# Patient Record
Sex: Female | Born: 2000 | Race: Black or African American | Hispanic: No | Marital: Single | Smoking: Never smoker
Health system: Southern US, Community
[De-identification: ages and names within clinical notes are randomized; demographics above are authoritative.]

## PROBLEM LIST (undated history)

## (undated) DIAGNOSIS — R7303 Prediabetes: Secondary | ICD-10-CM

## (undated) HISTORY — PX: TONSILLECTOMY: SUR1361

---
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
## 2022-11-02 ENCOUNTER — Ambulatory Visit (HOSPITAL_COMMUNITY): Payer: Self-pay

## 2022-11-02 ENCOUNTER — Ambulatory Visit (HOSPITAL_COMMUNITY)
Admission: EM | Admit: 2022-11-02 | Discharge: 2022-11-02 | Disposition: A | Discharge status: Home / Self Care | Payer: BC Managed Care – PPO | Attending: Emergency Medicine | Admitting: Emergency Medicine

## 2022-11-02 ENCOUNTER — Encounter (HOSPITAL_COMMUNITY): Payer: Self-pay

## 2022-11-02 DIAGNOSIS — N760 Acute vaginitis: Secondary | ICD-10-CM | POA: Diagnosis not present

## 2022-11-02 DIAGNOSIS — Z113 Encounter for screening for infections with a predominantly sexual mode of transmission: Secondary | ICD-10-CM | POA: Diagnosis not present

## 2022-11-02 LAB — HIV ANTIBODY (ROUTINE TESTING W REFLEX): HIV Screen 4th Generation wRfx: NONREACTIVE

## 2022-11-02 LAB — POCT URINALYSIS DIP (MANUAL ENTRY)
Bilirubin, UA: NEGATIVE
Blood, UA: NEGATIVE
Glucose, UA: 500 mg/dL — AB
Leukocytes, UA: NEGATIVE
Nitrite, UA: NEGATIVE
Protein Ur, POC: 100 mg/dL — AB
Spec Grav, UA: 1.025 (ref 1.010–1.025)
Urobilinogen, UA: 0.2 U/dL
pH, UA: 7 (ref 5.0–8.0)

## 2022-11-02 LAB — POCT URINE PREGNANCY: Preg Test, Ur: NEGATIVE

## 2022-11-02 NOTE — ED Provider Notes (Signed)
[facility]    CSN: [id number] Arrival date & time: 11/02/22  1519      History   Chief Complaint Chief Complaint  Patient presents with   Vaginal Discharge    HPI Sara Rocha is a 22 y.o. female.   Patient presents to clinic for complaints of increased vaginal discharge with a mild odor for the past 2 months.  She has tried over-the-counter Monistat without relief.  Denies any vaginal itching.  Denies any nausea or vomiting.  Initially had discomfort with urination, none currently.  Any flank pain.  No fevers.  Reports she is normally tachycardic at the doctors, she does get nervous.  She does have type 2 diabetes, reports this is controlled with injectables.  She does not check her blood sugars at home.   The history is provided by the patient and medical records.  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      Home Medications    Prior to Admission medications   Medication Sig Start Date End Date Taking? Authorizing Provider  fluticasone (FLONASE) 50 MCG/ACT nasal spray Place 2 sprays into both nostrils daily for 5 days. 10/23/21 10/28/21  Antony Madura, PA-C  Guaifenesin 1200 MG TB12 Take 1 tablet (1,200 mg total) by mouth in the morning and at bedtime. 10/19/21   Carlisle Beers, FNP  ranitidine (ZANTAC) 150 MG tablet TAke one  tablet, twice daily. Patient not taking: Reported on 10/19/2021 04/28/13 04/29/14  David Stall, MD    Family History Family History  Problem Relation Age of Onset   Obesity Mother    Diabetes Mother    Hypertension Mother     Social History Social History   Tobacco Use   Smoking status: Never   Smokeless tobacco: Never  Vaping Use   Vaping status: Never Used  Substance Use Topics   Alcohol use: Never   Drug use: Never     Allergies   Patient has no known allergies.   Review of Systems Review of Systems  Constitutional:  Negative for fever.  Gastrointestinal:  Negative for abdominal pain, nausea and vomiting.  Genitourinary:  Positive for vaginal discharge. Negative for dysuria, flank pain, frequency, menstrual problem, pelvic pain and urgency.     Physical Exam Triage Vital Signs ED Triage Vitals  Encounter Vitals Group     BP 11/02/22 1613 (!) 147/89     Systolic BP Percentile --      Diastolic BP Percentile --      Pulse Rate 11/02/22 1613 (!) 114     Resp 11/02/22 1613 16     Temp 11/02/22 1613 98.2 F (36.8 C)     Temp Source 11/02/22 1613 Oral     SpO2 11/02/22 1613 100 %     Weight --      Height --  Head Circumference --      Peak Flow --      Pain Score 11/02/22 1612 0     Pain Loc --      Pain Education --      Exclude from Growth Chart --    No data found.  Updated Vital Signs BP (!) 147/89 (BP Location: Right Arm)   Pulse (!) 114   Temp 98.2 F (36.8 C) (Oral)   Resp 16   LMP 10/19/2022 (Exact Date)   SpO2 100%   Visual Acuity Right Eye Distance:   Left Eye Distance:   Bilateral Distance:    Right Eye Near:   Left Eye Near:    Bilateral Near:     Physical Exam Vitals and nursing note reviewed.  Constitutional:      Appearance: Normal appearance.  HENT:     Head: Normocephalic and atraumatic.     Right Ear: External ear normal.     Left Ear: External ear normal.     Nose: Nose normal.     Mouth/Throat:      Mouth: Mucous membranes are moist.  Eyes:     Conjunctiva/sclera: Conjunctivae normal.  Cardiovascular:     Rate and Rhythm: Tachycardia present.  Musculoskeletal:        General: Normal range of motion.  Skin:    General: Skin is warm and dry.  Neurological:     General: No focal deficit present.     Mental Status: She is alert and oriented to person, place, and time.  Psychiatric:        Mood and Affect: Mood normal.        Behavior: Behavior normal.      UC Treatments / Results  Labs (all labs ordered are listed, but only abnormal results are displayed) Labs Reviewed  POCT URINALYSIS DIP (MANUAL ENTRY) - Abnormal; Notable for the following components:      Result Value   Glucose, UA =500 (*)    Ketones, POC UA trace (5) (*)    Protein Ur, POC =100 (*)    All other components within normal limits  RPR  HIV ANTIBODY (ROUTINE TESTING W REFLEX)  POCT URINE PREGNANCY  CERVICOVAGINAL ANCILLARY ONLY    EKG   Radiology No results found.  Procedures Procedures (including critical care time)  Medications Ordered in UC Medications - No data to display  Initial Impression / Assessment and Plan / UC Course  I have reviewed the triage vital signs and the nursing notes.  Pertinent labs & imaging results that were available during my care of the patient were reviewed by me and considered in my medical decision making (see chart for details).  Vitals and triage reviewed, patient is hemodynamically stable.  Has been having changes to vaginal discharge with odor for the past 2 months.  Cytology swab obtained.  Initially had dysuria, none currently.  UA does not show any acute infection.  Negative for pregnancy.  Will wait to treat until all results have been received.  Staff will contact if results requiring treatment.  Plan of care, follow-up care and return precautions given, no questions at this time.     Final Clinical Impressions(s) / UC Diagnoses   Final diagnoses:   Acute vaginitis  Screening examination for sexually transmitted disease     Discharge Instructions      Your urine did not show any signs of infection.  We have screened you for bacterial vaginosis, yeast and sexually transmitted infections.  Results will be available via MyChart and staff will contact you if results are positive, requiring treatment.  Abstain from intercourse until all results have been received and notify any sexual partners of positives.  Return to clinic for any new or urgent symptoms.     ED Prescriptions   None    PDMP not reviewed this encounter.   Dragon Thrush, [place], [place] 11/02/22 1721

## 2022-11-02 NOTE — ED Triage Notes (Signed)
Pt presents with c/o vaginal discharge x 2 mo.   Denies other sxs.  Pt states she wants STD testing and requests blood work.

## 2022-11-02 NOTE — Discharge Instructions (Signed)
Your urine did not show any signs of infection.  We have screened you for bacterial vaginosis, yeast and sexually transmitted infections.  Results will be available via MyChart and staff will contact you if results are positive, requiring treatment.  Abstain from intercourse until all results have been received and notify any sexual partners of positives.  Return to clinic for any new or urgent symptoms.

## 2022-11-03 ENCOUNTER — Telehealth (HOSPITAL_COMMUNITY): Payer: Self-pay

## 2022-11-03 ENCOUNTER — Telehealth (HOSPITAL_COMMUNITY): Payer: Self-pay | Admitting: Physician Assistant

## 2022-11-03 ENCOUNTER — Ambulatory Visit (HOSPITAL_COMMUNITY): Payer: Self-pay

## 2022-11-03 LAB — RPR: RPR Ser Ql: NONREACTIVE

## 2022-11-03 LAB — CERVICOVAGINAL ANCILLARY ONLY
Bacterial Vaginitis (gardnerella): NEGATIVE
Candida Glabrata: POSITIVE — AB
Candida Vaginitis: NEGATIVE
Chlamydia: NEGATIVE
Comment: NEGATIVE
Comment: NEGATIVE
Comment: NEGATIVE
Comment: NEGATIVE
Comment: NEGATIVE
Comment: NORMAL
Neisseria Gonorrhea: NEGATIVE
Trichomonas: NEGATIVE

## 2022-11-03 MED ORDER — FLUCONAZOLE 150 MG PO TABS: 150.0000 mg | ORAL_TABLET | ORAL | 0 refills | Status: AC

## 2022-11-03 NOTE — Telephone Encounter (Signed)
Pt returned this RN's phone call at this time. Pt voiced one of her test results came back positive & was told that if she would need treatment, we would send a prescription in for her. This RN made Angie PA aware, PA to call in medication to pharmacy of pt's request for pt to pick up.

## 2022-11-03 NOTE — Telephone Encounter (Signed)
This RN attempted to return pt's phone call x2 with no answer.

## 2022-11-03 NOTE — Telephone Encounter (Signed)
Positive for yeast, patient called department looking for treatment Rx Diflucan sent to pharmacy

## 2022-11-06 NOTE — Plan of Care (Signed)
[facility] Tonsillectomy/Adenoidectomy, Postoperative PEDS care plan entered in error.

## 2023-07-16 ENCOUNTER — Ambulatory Visit
Admission: RE | Admit: 2023-07-16 | Discharge: 2023-07-16 | Disposition: A | Discharge status: Home / Self Care | Payer: Self-pay | Source: Ambulatory Visit | Attending: Emergency Medicine | Admitting: Emergency Medicine

## 2023-07-16 ENCOUNTER — Ambulatory Visit (HOSPITAL_COMMUNITY): Payer: Self-pay

## 2023-07-16 VITALS — BP 129/72 | HR 94 | Temp 98.0°F | Resp 17

## 2023-07-16 DIAGNOSIS — M545 Low back pain, unspecified: Secondary | ICD-10-CM

## 2023-07-16 DIAGNOSIS — M7918 Myalgia, other site: Secondary | ICD-10-CM | POA: Diagnosis not present

## 2023-07-16 DIAGNOSIS — M62838 Other muscle spasm: Secondary | ICD-10-CM

## 2023-07-16 MED ORDER — CYCLOBENZAPRINE HCL 5 MG PO TABS: 5.0000 mg | ORAL_TABLET | Freq: Three times a day (TID) | ORAL | 0 refills | Status: AC | PRN

## 2023-07-16 NOTE — Discharge Instructions (Addendum)
 Take home meds as directed. Take flexeril ass prescribved, drowsiness precautions. May use heat or ice to back for comfort 20 min 3 x daily.  May use lidocaine  patch or biofreeze for pain.  Please follow up with PCP, may need to referral to physical therapy for further back pain management.  GO immediately to nearest ER or call 9-1-1 for loss of bowel and bladder,loss of function, saddle numbness, etc.  Pt advised otc tylenol  as label directed for pain Avoid lifting,turning,bending as this will aggravate your back
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
## 2023-07-23 NOTE — Progress Notes (Unsigned)
    [name] [address] [place] [address] Phone: [phone]   Assessment and Plan:     There are no diagnoses linked to this encounter.  ***   Pertinent previous records reviewed include ***    Follow Up: ***     Subjective:   I, Damontay Alred, am serving as a Neurosurgeon for Doctor Morene Mace  Chief Complaint: back pain   HPI:   07/24/2023 Patient is a 23 year old with back pain. Patient states   Relevant Historical Information: ***  Additional pertinent review of systems negative.   Current Outpatient Medications:    fluconazole  (DIFLUCAN ) 150 MG tablet, Take 1 tablet (150 mg total) by mouth every 3 (three) days. (Patient not taking: Reported on 07/16/2023), Disp: 2 tablet, Rfl: 0   fluticasone  (FLONASE ) 50 MCG/ACT nasal spray, Place 2 sprays into both nostrils daily for 5 days., Disp: 11.1 mL, Rfl: 0   Guaifenesin  1200 MG TB12, Take 1 tablet (1,200 mg total) by mouth in the morning and at bedtime., Disp: 14 tablet, Rfl: 0   ranitidine  (ZANTAC ) 150 MG tablet, TAke one tablet, twice daily. (Patient not taking: Reported on 10/19/2021), Disp: 60 tablet, Rfl: 6   Objective:     There were no vitals filed for this visit.    There is no height or weight on file to calculate BMI.    Physical Exam:    ***   Electronically signed by:  Odis Mace [name] 7:41 AM 07/23/23

## 2023-07-24 ENCOUNTER — Ambulatory Visit (INDEPENDENT_AMBULATORY_CARE_PROVIDER_SITE_OTHER)

## 2023-07-24 ENCOUNTER — Ambulatory Visit: Admitting: Sports Medicine

## 2023-07-24 VITALS — BP 124/82 | Ht 63.0 in | Wt 289.0 lb

## 2023-07-24 DIAGNOSIS — M545 Low back pain, unspecified: Secondary | ICD-10-CM | POA: Diagnosis not present

## 2023-07-24 DIAGNOSIS — M4125 Other idiopathic scoliosis, thoracolumbar region: Secondary | ICD-10-CM | POA: Diagnosis not present

## 2023-07-24 MED ORDER — CYCLOBENZAPRINE HCL 5 MG PO TABS: 5.0000 mg | ORAL_TABLET | Freq: Every day | ORAL | 0 refills | Status: AC

## 2023-07-24 MED ORDER — MELOXICAM 15 MG PO TABS: 15.0000 mg | ORAL_TABLET | Freq: Every day | ORAL | 0 refills | Status: DC

## 2023-07-24 NOTE — Patient Instructions (Addendum)
-   Start meloxicam  15 mg daily x2 weeks.  If still having pain after 2 weeks, complete 3rd-week of NSAID. May use remaining NSAID as needed once daily for pain control.  Do not to use additional over-the-counter NSAIDs (ibuprofen , naproxen , Advil , Aleve , etc.) while taking prescription NSAIDs.  May use Tylenol  [phone] mg 2 to 3 times a day for breakthrough pain. Flexeril  5 mg nightly as needed  Low back HEP  Work note provided she should not stand for 10 minutes at a time. She should be allowed to sit when needed 4 week follow up

## 2023-07-25 ENCOUNTER — Ambulatory Visit: Payer: Self-pay | Admitting: Sports Medicine
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
## 2023-08-21 ENCOUNTER — Ambulatory Visit: Admitting: Sports Medicine

## 2023-08-21 VITALS — HR 114 | Ht 63.0 in | Wt 288.0 lb

## 2023-08-21 DIAGNOSIS — M4125 Other idiopathic scoliosis, thoracolumbar region: Secondary | ICD-10-CM

## 2023-08-21 DIAGNOSIS — M545 Low back pain, unspecified: Secondary | ICD-10-CM | POA: Diagnosis not present

## 2023-08-21 MED ORDER — MELOXICAM 15 MG PO TABS: 15.0000 mg | ORAL_TABLET | Freq: Every day | ORAL | 0 refills | Status: AC | PRN

## 2023-08-21 MED ORDER — METHYLPREDNISOLONE 4 MG PO TBPK: ORAL_TABLET | ORAL | 0 refills | Status: AC

## 2023-08-21 NOTE — Patient Instructions (Signed)
 Prednisone dos pak   Tylenol  [phone] mg 2-3 times a day for pain relief   - Use meloxicam  15 mg daily as needed for pain.  Recommend limiting chronic NSAIDs to 1-2 doses per week to prevent long-term side effects.  Continue HEP   4 week follow up
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
## 2023-09-19 ENCOUNTER — Ambulatory Visit: Admitting: Sports Medicine
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
## 2023-10-01 ENCOUNTER — Ambulatory Visit: Admitting: Sports Medicine
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
## 2023-10-15 ENCOUNTER — Ambulatory Visit: Admitting: Sports Medicine

## 2023-10-31 ENCOUNTER — Encounter: Payer: Self-pay | Admitting: Sports Medicine
